# Patient Record
Sex: Male | Born: 1955 | Race: Black or African American | Hispanic: No | Marital: Single | Smoking: Former smoker
Health system: Southern US, Community
[De-identification: ages and names within clinical notes are randomized; demographics above are authoritative.]

## PROBLEM LIST (undated history)

## (undated) DIAGNOSIS — I10 Essential (primary) hypertension: Secondary | ICD-10-CM

## (undated) DIAGNOSIS — M109 Gout, unspecified: Secondary | ICD-10-CM

## (undated) HISTORY — PX: BACK SURGERY: SHX140

---

## 2009-11-25 ENCOUNTER — Emergency Department (HOSPITAL_COMMUNITY): Admission: EM | Admit: 2009-11-25 | Discharge: 2009-11-25 | Payer: Self-pay | Admitting: Emergency Medicine

## 2009-11-26 ENCOUNTER — Emergency Department (HOSPITAL_COMMUNITY): Admission: EM | Admit: 2009-11-26 | Discharge: 2009-11-26 | Payer: Self-pay | Admitting: Family Medicine

## 2009-12-20 ENCOUNTER — Emergency Department (HOSPITAL_COMMUNITY): Admission: EM | Admit: 2009-12-20 | Discharge: 2009-12-20 | Payer: Self-pay | Admitting: Family Medicine

## 2011-05-26 DEATH — deceased

## 2011-09-26 ENCOUNTER — Emergency Department (HOSPITAL_COMMUNITY)
Admission: EM | Admit: 2011-09-26 | Discharge: 2011-09-26 | Disposition: A | Discharge status: Home Health | Payer: Self-pay | Attending: Emergency Medicine | Admitting: Emergency Medicine

## 2011-09-26 ENCOUNTER — Encounter (HOSPITAL_COMMUNITY): Payer: Self-pay | Admitting: *Deleted

## 2011-09-26 DIAGNOSIS — Z7982 Long term (current) use of aspirin: Secondary | ICD-10-CM | POA: Insufficient documentation

## 2011-09-26 DIAGNOSIS — W57XXXA Bitten or stung by nonvenomous insect and other nonvenomous arthropods, initial encounter: Secondary | ICD-10-CM | POA: Insufficient documentation

## 2011-09-26 DIAGNOSIS — T148 Other injury of unspecified body region: Secondary | ICD-10-CM | POA: Insufficient documentation

## 2011-09-26 DIAGNOSIS — I10 Essential (primary) hypertension: Secondary | ICD-10-CM | POA: Insufficient documentation

## 2011-09-26 DIAGNOSIS — F17200 Nicotine dependence, unspecified, uncomplicated: Secondary | ICD-10-CM | POA: Insufficient documentation

## 2011-09-26 HISTORY — DX: Essential (primary) hypertension: I10

## 2011-09-26 MED ORDER — CETIRIZINE-PSEUDOEPHEDRINE ER 5-120 MG PO TB12: 1.0000 | ORAL_TABLET | Freq: Two times a day (BID) | ORAL | Status: DC

## 2011-09-26 MED ORDER — FAMOTIDINE 20 MG PO TABS: 20.0000 mg | ORAL_TABLET | Freq: Two times a day (BID) | ORAL | Status: DC

## 2011-09-26 MED ORDER — LISINOPRIL 10 MG PO TABS: 40.0000 mg | ORAL_TABLET | Freq: Every day | ORAL | Status: DC

## 2011-09-26 MED ORDER — CETIRIZINE HCL 5 MG/5ML PO SYRP
5.0000 mg | ORAL_SOLUTION | Freq: Once | ORAL | Status: AC
  Administered 2011-09-26: 5 mg via ORAL
  Filled 2011-09-26: qty 5

## 2011-09-26 MED ORDER — ACYCLOVIR 400 MG PO TABS: 400.0000 mg | ORAL_TABLET | Freq: Two times a day (BID) | ORAL | Status: AC

## 2011-09-26 MED ORDER — HYDROCHLOROTHIAZIDE 25 MG PO TABS: 25.0000 mg | ORAL_TABLET | Freq: Every day | ORAL | Status: DC

## 2011-09-26 MED ORDER — ASPIRIN 81 MG PO CHEW: 81.0000 mg | CHEWABLE_TABLET | Freq: Every day | ORAL | Status: DC

## 2011-09-26 MED ORDER — FAMOTIDINE 20 MG PO TABS
20.0000 mg | ORAL_TABLET | Freq: Once | ORAL | Status: AC
  Administered 2011-09-26: 20 mg via ORAL
  Filled 2011-09-26: qty 1

## 2011-09-26 NOTE — ED Notes (Signed)
Pt states stayed in hotel last night; woke up with multiple bug bites over body

## 2011-09-26 NOTE — ED Provider Notes (Signed)
History     CSN: [id number]  Arrival date & time 09/26/11  0620   First MD Initiated Contact with Patient 09/26/11 0700      Chief Complaint  Patient presents with  . Insect Bite     HPI  The patient p/w concerns of insect bites.  He was in his [facility] until last night.  He stayed in a new hotel (in town for work) and awoke a few hours ago with innumerable pruritic lesions.  He showered, and changed his clothes, and now presents for evaluation.  He denies any ongoing f/c, n/v.  The lesions remain itchy, but are not painful.  No intra-oral or genital lesions.  Past Medical History  Diagnosis Date  . Hypertension     Past Surgical History  Procedure Date  . Back surgery     History reviewed. No pertinent family history.  History  Substance Use Topics  . Smoking status: Current Everyday Smoker -- 0.5 packs/day  . Smokeless tobacco: Not on file  . Alcohol Use: No      Review of Systems  All other systems reviewed and are negative.    Allergies  Review of patient's allergies indicates no known allergies.  Home Medications   Current Outpatient Rx  Name Route Sig Dispense Refill  . ASPIRIN 325 MG PO TABS Oral Take 650 mg by mouth daily.    Marland Kitchen CETIRIZINE-PSEUDOEPHEDRINE ER 5-120 MG PO TB12 Oral Take 1 tablet by mouth 2 (two) times daily. 4 tablet 0  . FAMOTIDINE 20 MG PO TABS Oral Take 1 tablet (20 mg total) by mouth 2 (two) times daily. 4 tablet 0    BP 157/107  Pulse 73  Temp(Src) 97.9 F (36.6 C) (Oral)  Resp 20  SpO2 97%  Physical Exam  Nursing note and vitals reviewed. Constitutional: He is oriented to person, place, and time. He appears well-developed and well-nourished. No distress.  HENT:  Head: Normocephalic and atraumatic.  Eyes: Conjunctivae are normal. Pupils are equal, round, and reactive to light.  Cardiovascular: Normal rate and regular rhythm.   Pulmonary/Chest: Effort normal. No stridor. No respiratory distress.  Abdominal: He exhibits no  distension.  Musculoskeletal: He exhibits no edema.  Neurological: He is alert and oriented to person, place, and time. No cranial nerve deficit. He exhibits normal muscle tone. Coordination normal.  Skin: He is not diaphoretic.       Innumerable minimally raised, independent erythematous lesions, ~3/4cm in diameter all about the habitus.  No ulcerations, no spreading or confluent erythema.    ED Course  Procedures (including critical care time)  Labs Reviewed - No data to display No results found.   1. Bed bug bite       MDM  This generally well M presents with new lesions c/w bed bug bites.  There are no signs concerning for cellulitis or confluent infections.  Prior to discharge, he requested refills of his anti-htn and anti-virals.  He denies new outbreaks, but these requests were accomodated.     Gerhard Munch, MD 09/26/11 [phone]

## 2011-09-26 NOTE — Discharge Instructions (Signed)
Bedbugs  Bedbugs are tiny bugs that live in and around beds. They come out at night and bite people lying in bed. Bedbug bites rarely cause a medical problem. The bites do cause red, itchy bumps.  HOME CARE   Only take medicine as told by your doctor.    Wear pajamas with long sleeves and pant legs.    Call a pest control expert. You may need to throw away your mattress. Ask the pest control expert what you can do to keep the bedbugs from coming back. You may need to:    Put a plastic cover over your mattress.    Wash your clothes and bedding in hot water. Dry them in a hot dryer. The temperature should be hotter than 120 F (48.9 C).    Vacuum all around your bed often.    Check all used furniture, bedding, or clothes for bedbugs before you bring them into your house.    Remove bird nests and bat perches around your home.    After you travel, check your clothes and luggage for bedbugs before you bring them into your house. If you find any bedbugs, throw those items away.   GET HELP RIGHT AWAY IF:   You have a fever.    You have red bug bites that keep coming back.    You have red bug bites that itch badly.    You have bug bites that cause a skin rash.    You have scratch marks that are red and sore.   MAKE SURE YOU:   Understand these instructions.    Will watch your condition.    Will get help right away if you are not doing well or get worse.   Document Released: 07/26/2010 Document Revised: 03/30/2011 Document Reviewed: 07/26/2010  [employer] Patient Information 2012 [employer].

## 2011-09-26 NOTE — ED Notes (Signed)
MD at bedside. 

## 2011-12-18 ENCOUNTER — Encounter (HOSPITAL_COMMUNITY): Payer: Self-pay | Admitting: *Deleted

## 2011-12-18 ENCOUNTER — Emergency Department (HOSPITAL_COMMUNITY)
Admission: EM | Admit: 2011-12-18 | Discharge: 2011-12-18 | Disposition: A | Discharge status: Home / Self Care | Payer: Self-pay | Attending: Emergency Medicine | Admitting: Emergency Medicine

## 2011-12-18 ENCOUNTER — Emergency Department (HOSPITAL_COMMUNITY): Payer: Self-pay

## 2011-12-18 DIAGNOSIS — Z7982 Long term (current) use of aspirin: Secondary | ICD-10-CM | POA: Insufficient documentation

## 2011-12-18 DIAGNOSIS — R0602 Shortness of breath: Secondary | ICD-10-CM | POA: Insufficient documentation

## 2011-12-18 DIAGNOSIS — R079 Chest pain, unspecified: Secondary | ICD-10-CM | POA: Insufficient documentation

## 2011-12-18 DIAGNOSIS — I10 Essential (primary) hypertension: Secondary | ICD-10-CM | POA: Insufficient documentation

## 2011-12-18 LAB — COMPREHENSIVE METABOLIC PANEL
AST: 41 U/L — ABNORMAL HIGH (ref 0–37)
Albumin: 3.7 g/dL (ref 3.5–5.2)
Chloride: 108 mEq/L (ref 96–112)
Creatinine, Ser: 1.3 mg/dL (ref 0.50–1.35)
GFR calc Af Amer: 70 mL/min — ABNORMAL LOW (ref >90)
GFR calc non Af Amer: 61 mL/min — ABNORMAL LOW (ref >90)
Glucose, Bld: 86 mg/dL (ref 70–99)
Sodium: 142 mEq/L (ref 135–145)
Total Bilirubin: 0.5 mg/dL (ref 0.3–1.2)
Total Protein: 7.4 g/dL (ref 6.0–8.3)

## 2011-12-18 LAB — CBC WITH DIFFERENTIAL/PLATELET
Basophils Absolute: 0 10*3/uL (ref 0.0–0.1)
Eosinophils Absolute: 0.1 10*3/uL (ref 0.0–0.7)
Eosinophils Relative: 2 % (ref 0–5)
HCT: 46.3 % (ref 39.0–52.0)
Hemoglobin: 15.9 g/dL (ref 13.0–17.0)
Lymphs Abs: 1.7 10*3/uL (ref 0.7–4.0)
MCV: 83.7 fL (ref 78.0–100.0)
Monocytes Absolute: 0.5 10*3/uL (ref 0.1–1.0)
Neutro Abs: 1.8 10*3/uL (ref 1.7–7.7)
Neutrophils Relative %: 44 % (ref 43–77)
RDW: 13.7 % (ref 11.5–15.5)
WBC: 4.1 10*3/uL (ref 4.0–10.5)

## 2011-12-18 MED ORDER — HYDROCHLOROTHIAZIDE 25 MG PO TABS: 25.0000 mg | ORAL_TABLET | Freq: Every day | ORAL | Status: DC

## 2011-12-18 NOTE — ED Notes (Signed)
Patient transported to X-ray 

## 2011-12-18 NOTE — ED Provider Notes (Signed)
History     CSN: [id number]  Arrival date & time 12/18/11  1134   First MD Initiated Contact with Patient 12/18/11 1206      Chief Complaint  Patient presents with  . Shortness of Breath  . Chest Pain   HPI 56 yo male with h/o HTN who presents with 2 days of "feeling bad". Reports shortness of breath, associated with anterior chest discomfort, worst after smoking. Reports productive cough x2 days. Describes sharp shooting left sided chest pain, lasting a few seconds, not worst with activity. Overall feeling weak and lightheaded. Light headedness is worst when he stands up quickly.  Had a headache yesterday for which he took excedrin with relief of symptoms.  No fevers or chills. No sick contacts.  Past Medical History  Diagnosis Date  . Hypertension     Past Surgical History  Procedure Date  . Back surgery     No family history on file.  History  Substance Use Topics  . Smoking status: Current Everyday Smoker -- 0.5 packs/day  . Smokeless tobacco: Not on file  . Alcohol Use: No     Review of Systems  Constitutional: Positive for fatigue. Negative for fever and chills.  Respiratory: Positive for cough and shortness of breath. Negative for wheezing.   Cardiovascular: Positive for chest pain. Negative for palpitations and leg swelling.  Gastrointestinal: Negative for nausea, abdominal pain and diarrhea.  Genitourinary: Negative for dysuria and difficulty urinating.  Musculoskeletal: Positive for myalgias. Negative for back pain and arthralgias.  Skin: Negative for rash.  Neurological: Positive for weakness, light-headedness and headaches. Negative for dizziness and numbness.  Psychiatric/Behavioral: Negative for behavioral problems and agitation.    Allergies  Review of patient's allergies indicates no known allergies.  Home Medications   Current Outpatient Rx  Name Route Sig Dispense Refill  . ASPIRIN 81 MG PO CHEW Oral Chew 81 mg by mouth daily.    .  ASPIRIN-ACETAMINOPHEN-CAFFEINE 250-250-65 MG PO TABS Oral Take 1 tablet by mouth every 6 (six) hours as needed. Pain    . HYDROCHLOROTHIAZIDE 25 MG PO TABS Oral Take 25 mg by mouth daily.    Marland Kitchen LISINOPRIL 10 MG PO TABS Oral Take 40 mg by mouth daily.    Marland Kitchen HYDROCHLOROTHIAZIDE 25 MG PO TABS Oral Take 1 tablet (25 mg total) by mouth daily. 30 tablet 0    BP 151/98  Pulse 57  Temp 99 F (37.2 C) (Oral)  Resp 18  SpO2 96%  Physical Exam  Constitutional: He is oriented to person, place, and time. No distress.  HENT:  Head: Normocephalic.  Mouth/Throat: Oropharynx is clear and moist.  Eyes: EOM are normal. Pupils are equal, round, and reactive to light.  Neck: Normal range of motion. Neck supple.  Cardiovascular: Normal rate and regular rhythm.   No murmur heard. Pulmonary/Chest: Effort normal and breath sounds normal. No respiratory distress. He has no wheezes.       Speaking in full sentences on room air  Abdominal: Soft. Bowel sounds are normal. He exhibits no distension. There is no tenderness.  Musculoskeletal: He exhibits no edema.  Neurological: He is alert and oriented to person, place, and time. No cranial nerve deficit.       5/5 strength in upper and lower extremities bilaterally  Skin: Skin is warm and dry. No rash noted.  Psychiatric: He has a normal mood and affect. His behavior is normal.    ED Course  Procedures (including critical care time)  Date: 12/18/2011  Rate: 65  Rhythm: normal sinus rhythm  QRS Axis: normal  Intervals: normal  ST/T Wave abnormalities: no acute ST elevation   Conduction Disutrbances: none  Old EKG Reviewed: none available   CBC    Component Value Date/Time   WBC 4.1 12/18/2011 1247   RBC 5.53 12/18/2011 1247   HGB 15.9 12/18/2011 1247   HCT 46.3 12/18/2011 1247   PLT 198 12/18/2011 1247   MCV 83.7 12/18/2011 1247   MCH 28.8 12/18/2011 1247   MCHC 34.3 12/18/2011 1247   RDW 13.7 12/18/2011 1247   LYMPHSABS 1.7 12/18/2011 1247   MONOABS  0.5 12/18/2011 1247   EOSABS 0.1 12/18/2011 1247   BASOSABS 0.0 12/18/2011 1247    CMP     Component Value Date/Time   NA 142 12/18/2011 1247   K 4.0 12/18/2011 1247   CL 108 12/18/2011 1247   CO2 25 12/18/2011 1247   GLUCOSE 86 12/18/2011 1247   BUN 12 12/18/2011 1247   CREATININE 1.30 12/18/2011 1247   CALCIUM 9.2 12/18/2011 1247   PROT 7.4 12/18/2011 1247   ALBUMIN 3.7 12/18/2011 1247   AST 41* 12/18/2011 1247   ALT 30 12/18/2011 1247   ALKPHOS 58 12/18/2011 1247   BILITOT 0.5 12/18/2011 1247   GFRNONAA 61* 12/18/2011 1247   GFRAA 70* 12/18/2011 1247     Labs Reviewed  COMPREHENSIVE METABOLIC PANEL - Abnormal; Notable for the following:    AST 41 (*)     GFR calc non Af Amer 61 (*)     GFR calc Af Amer 70 (*)     All other components within normal limits  CBC WITH DIFFERENTIAL  POCT I-STAT TROPONIN I   Dg Chest 2 View  12/18/2011  *RADIOLOGY REPORT*  Clinical Data: Chest pain with shortness of breath for 3 days.  CHEST - 2 VIEW  Comparison: None.  Findings: The heart size and mediastinal contours are normal aside from mild aortic tortuosity.  The lungs are clear.  There is no pleural effusion or pneumothorax.  No acute osseous findings are identified.  Telemetry leads overlie the chest.  IMPRESSION: No active cardiopulmonary process.   Original Report Authenticated By: Gerrianne Scale, M.D.     1. Hypertension   2. Shortness of breath       MDM  No evidence of acute cardiopulmonary process on CXR. Negative troponin. Vital signs stable. No evidence of hypoxia or tachypnea. Given risk factors of smoking history, Hypertension and possible family history, recommended outpatient stress test follow up within the next week or so.  Reviewed red flags for return to ED.  BMP within normal limits. Refilled home HCTZ 25mg  daily since patient reported being out. Gave resource sheet for primary care doctor.         Lonia Skinner, MD 12/18/11 1610  Lonia Skinner, MD 12/18/11  [phone]

## 2011-12-18 NOTE — ED Notes (Signed)
MD at bedside. 

## 2011-12-18 NOTE — ED Notes (Signed)
Pt placed on cardiac monitor on arrival to room, 2L [place] placed on pt. Dr at bedside on RN arrival to room.

## 2011-12-20 NOTE — ED Provider Notes (Signed)
I saw and evaluated the patient, reviewed the resident's note and I agree with the findings and plan. Pt states recently when smoking feels funny in chest as if something out of place or wrong. States he is worried he may have lung ca. Pt had a few second episode of sharp cp. No persistent or pleuritic cp. No exertional cp. No sob. No nv or diaphoreisis. Chest cta.   Suzi Roots, MD 12/20/11 [phone]

## 2011-12-29 ENCOUNTER — Encounter (HOSPITAL_COMMUNITY): Payer: Self-pay | Admitting: Emergency Medicine

## 2011-12-29 ENCOUNTER — Emergency Department (INDEPENDENT_AMBULATORY_CARE_PROVIDER_SITE_OTHER)
Admission: EM | Admit: 2011-12-29 | Discharge: 2011-12-29 | Disposition: A | Discharge status: Home / Self Care | Payer: Self-pay | Source: Home / Self Care | Attending: Emergency Medicine | Admitting: Emergency Medicine

## 2011-12-29 DIAGNOSIS — R369 Urethral discharge, unspecified: Secondary | ICD-10-CM

## 2011-12-29 LAB — POCT URINALYSIS DIP (DEVICE)
Hgb urine dipstick: NEGATIVE
Nitrite: NEGATIVE
Protein, ur: NEGATIVE mg/dL
pH: 6.5 (ref 5.0–8.0)

## 2011-12-29 MED ORDER — ACYCLOVIR 400 MG PO TABS: 400.0000 mg | ORAL_TABLET | Freq: Two times a day (BID) | ORAL | Status: DC

## 2011-12-29 NOTE — ED Notes (Signed)
Urine completed and reveiwed by physician, ready for discharge

## 2011-12-29 NOTE — ED Notes (Signed)
Discharge pending second urine specimen for second urine related test

## 2011-12-29 NOTE — ED Notes (Signed)
Reports penile discharge for 7 days .  Herpes breakout is getting ready to occur per patient, out of acyclovir.

## 2011-12-29 NOTE — ED Provider Notes (Signed)
History     CSN: [id number]  Arrival date & time 12/29/11  1122   None     Chief Complaint  Patient presents with  . Exposure to STD    (Consider location/radiation/quality/duration/timing/severity/associated sxs/prior treatment) The history is provided by the patient.  56 y.o. male complains of yellow penis discharge for 7 days.  + pelvic pain, no fever.  No UTI symptoms. Sexually active, sometimes uses condoms, new partner.  Last unprotected intercourse 2 weeks ago.  Denies history of known exposure to STD or symptoms in partner.  + hx of chlamydia, states feels like same symptoms.  Request refill of acyclovir for known history of genital herpes.    Past Medical History  Diagnosis Date  . Hypertension     Past Surgical History  Procedure Date  . Back surgery     No family history on file.  History  Substance Use Topics  . Smoking status: Current Everyday Smoker -- 0.5 packs/day  . Smokeless tobacco: Not on file  . Alcohol Use: Yes      Review of Systems  Constitutional: Negative.   Respiratory: Negative.   Cardiovascular: Negative.   Gastrointestinal: Positive for abdominal pain. Negative for nausea, vomiting and diarrhea.  Genitourinary: Positive for discharge. Negative for dysuria, urgency, flank pain, decreased urine volume, penile swelling, penile pain and testicular pain.    Allergies  Review of patient's allergies indicates no known allergies.  Home Medications   Current Outpatient Rx  Name Route Sig Dispense Refill  . HYDROCHLOROTHIAZIDE 25 MG PO TABS Oral Take 25 mg by mouth daily.    . ACYCLOVIR 400 MG PO TABS Oral Take 1 tablet (400 mg total) by mouth 2 (two) times daily. 60 tablet 5  . ASPIRIN 81 MG PO CHEW Oral Chew 81 mg by mouth daily.    . ASPIRIN-ACETAMINOPHEN-CAFFEINE 250-250-65 MG PO TABS Oral Take 1 tablet by mouth every 6 (six) hours as needed. Pain    . HYDROCHLOROTHIAZIDE 25 MG PO TABS Oral Take 1 tablet (25 mg total) by mouth daily. 30  tablet 0  . LISINOPRIL 10 MG PO TABS Oral Take 40 mg by mouth daily.      BP 158/97  Pulse 51  Temp 98 F (36.7 C) (Oral)  Resp 18  SpO2 99%  Physical Exam  Nursing note and vitals reviewed. Constitutional: He is oriented to person, place, and time. Vital signs are normal. He appears well-developed and well-nourished. He is active and cooperative.  HENT:  Head: Normocephalic.  Mouth/Throat: Oropharynx is clear and moist. No oropharyngeal exudate.  Eyes: Conjunctivae are normal. Pupils are equal, round, and reactive to light. No scleral icterus.  Neck: Trachea normal. Neck supple.  Cardiovascular: Normal rate and regular rhythm.   Pulmonary/Chest: Effort normal and breath sounds normal.  Abdominal: Soft. Bowel sounds are normal. There is no tenderness. There is no rebound.  Genitourinary: Testes normal and penis normal. Cremasteric reflex is present. Circumcised. No penile tenderness. No discharge found.       2 Lesion noted on glans  Lymphadenopathy:    He has no cervical adenopathy.    He has no axillary adenopathy.       Right: No inguinal adenopathy present.       Left: No inguinal adenopathy present.  Neurological: He is alert and oriented to person, place, and time. No cranial nerve deficit or sensory deficit.  Skin: Skin is warm and dry.  Psychiatric: He has a normal mood and affect. His speech  is normal and behavior is normal. Judgment and thought content normal. Cognition and memory are normal.    ED Course  Procedures (including critical care time)   Labs Reviewed  GC/CHLAMYDIA PROBE AMP, URINE  POCT URINALYSIS DIP (DEVICE)   No results found.   1. Penile discharge       MDM  Sent off GC/chlamydia. Will not treat empirically now. Advised patient to refrain from sexual contact until he knows lab results, symptoms resolve, and partner(s) are treated. Pt provided working phone number. Pt agrees.         Johnsie Kindred, NP 01/02/12 1440

## 2011-12-29 NOTE — ED Notes (Signed)
Patient is argumentative.  Repeatedly reviewed discharge instructions and rational.  Carmen, pa stepped to the treatment room with lengthy, understandable explanation.  Community resources will be added to discharge instructions and quantity of acyclovir reordered by carmen, np

## 2012-01-02 NOTE — ED Provider Notes (Signed)
Medical screening examination/treatment/procedure(s) were performed by non-physician practitioner and as supervising physician I was immediately available for consultation/collaboration.  Raynald Blend, MD 01/02/12 [phone]

## 2012-04-08 ENCOUNTER — Encounter (HOSPITAL_COMMUNITY): Payer: Self-pay | Admitting: *Deleted

## 2012-04-08 ENCOUNTER — Emergency Department (HOSPITAL_COMMUNITY)
Admission: EM | Admit: 2012-04-08 | Discharge: 2012-04-08 | Disposition: A | Discharge status: Home / Self Care | Payer: Self-pay | Attending: Emergency Medicine | Admitting: Emergency Medicine

## 2012-04-08 ENCOUNTER — Other Ambulatory Visit: Payer: Self-pay

## 2012-04-08 DIAGNOSIS — F17200 Nicotine dependence, unspecified, uncomplicated: Secondary | ICD-10-CM | POA: Insufficient documentation

## 2012-04-08 DIAGNOSIS — R51 Headache: Secondary | ICD-10-CM | POA: Insufficient documentation

## 2012-04-08 DIAGNOSIS — Z7982 Long term (current) use of aspirin: Secondary | ICD-10-CM | POA: Insufficient documentation

## 2012-04-08 DIAGNOSIS — Z79899 Other long term (current) drug therapy: Secondary | ICD-10-CM | POA: Insufficient documentation

## 2012-04-08 DIAGNOSIS — I10 Essential (primary) hypertension: Secondary | ICD-10-CM | POA: Insufficient documentation

## 2012-04-08 LAB — CBC WITH DIFFERENTIAL/PLATELET
Eosinophils Absolute: 0.1 10*3/uL (ref 0.0–0.7)
HCT: 48 % (ref 39.0–52.0)
Hemoglobin: 16.2 g/dL (ref 13.0–17.0)
Lymphs Abs: 2.2 10*3/uL (ref 0.7–4.0)
MCH: 29.4 pg (ref 26.0–34.0)
Monocytes Absolute: 0.4 10*3/uL (ref 0.1–1.0)
Monocytes Relative: 11 % (ref 3–12)
Neutro Abs: 1.2 10*3/uL — ABNORMAL LOW (ref 1.7–7.7)
Neutrophils Relative %: 31 % — ABNORMAL LOW (ref 43–77)
RBC: 5.51 MIL/uL (ref 4.22–5.81)

## 2012-04-08 LAB — POCT I-STAT TROPONIN I

## 2012-04-08 LAB — BASIC METABOLIC PANEL
CO2: 28 mEq/L (ref 19–32)
Chloride: 102 mEq/L (ref 96–112)
GFR calc non Af Amer: >90 mL/min (ref >90)
Glucose, Bld: 88 mg/dL (ref 70–99)
Potassium: 4.1 mEq/L (ref 3.5–5.1)
Sodium: 140 mEq/L (ref 135–145)

## 2012-04-08 MED ORDER — LISINOPRIL 10 MG PO TABS: 10.0000 mg | ORAL_TABLET | Freq: Every day | ORAL | Status: DC

## 2012-04-08 MED ORDER — HYDROCHLOROTHIAZIDE 25 MG PO TABS: 25.0000 mg | ORAL_TABLET | Freq: Every day | ORAL | Status: DC

## 2012-04-08 NOTE — ED Provider Notes (Signed)
History     CSN: [id number]  Arrival date & time 04/08/12  1314   First MD Initiated Contact with Patient 04/08/12 1958      Chief Complaint  Patient presents with  . Hypertension  . Headache     HPI Elevated HTN Onset - unknown time ago Course - stable Worsened by - nothing Improved by - nothing  Pt presents with HTN - he reports he didn't feel well so he went to [facility] to have his BP checked and it was elevated.  He came to be evaluated He has not taken his meds in "a long time" No cp/sob.  He reports mild headache.  No visual changes.  No focal weakness.   Past Medical History  Diagnosis Date  . Hypertension     Past Surgical History  Procedure Date  . Back surgery     No family history on file.  History  Substance Use Topics  . Smoking status: Current Every Day Smoker -- 0.5 packs/day  . Smokeless tobacco: Not on file  . Alcohol Use: Yes      Review of Systems  Constitutional: Negative for fever.  Respiratory: Negative for shortness of breath.   Cardiovascular: Negative for chest pain.    Allergies  Review of patient's allergies indicates no known allergies.  Home Medications   Current Outpatient Rx  Name  Route  Sig  Dispense  Refill  . ACYCLOVIR 400 MG PO TABS   Oral   Take 1 tablet (400 mg total) by mouth 2 (two) times daily.   60 tablet   5   . ASPIRIN 81 MG PO CHEW   Oral   Chew 81 mg by mouth daily.         Marland Kitchen HYDROCHLOROTHIAZIDE 25 MG PO TABS   Oral   Take 1 tablet (25 mg total) by mouth daily.   30 tablet   0   . LISINOPRIL 10 MG PO TABS   Oral   Take 1 tablet (10 mg total) by mouth daily.   30 tablet   0     BP 164/102  Pulse 63  Temp 98 F (36.7 C) (Oral)  Resp 18  SpO2 96%  Physical Exam CONSTITUTIONAL: Well developed/well nourished HEAD AND FACE: Normocephalic/atraumatic EYES: EOMI ENMT: Mucous membranes moist NECK: supple no meningeal signs CV: S1/S2 noted, no murmurs/rubs/gallops noted LUNGS:  Lungs are clear to auscultation bilaterally, no apparent distress ABDOMEN: soft, nontender, no rebound or guarding NEURO: Pt is awake/alert, moves all extremitiesx4 No ataxia.  No arm/leg drift.  No facial droop EXTREMITIES: pulses normal, full ROM SKIN: warm, color normal PSYCH: no abnormalities of mood noted  ED Course  Procedures  Labs Reviewed  CBC WITH DIFFERENTIAL - Abnormal; Notable for the following:    Neutrophils Relative 31 (*)     Neutro Abs 1.2 (*)     Lymphocytes Relative 55 (*)     All other components within normal limits  BASIC METABOLIC PANEL  POCT I-STAT TROPONIN I     1. HTN (hypertension)     Pt well appearing no distress, watching TV.  I doubt acute HTN emergency He has been on HCTZ/lisinopril before, will restart Advised to f/u as outpatient   MDM  Nursing notes including past medical history and social history reviewed and considered in documentation Labs/vital reviewed and considered        Date: 04/08/2012  Rate: 62  Rhythm: normal sinus rhythm  QRS Axis: normal  Intervals:  normal  ST/T Wave abnormalities: nonspecific ST changes  Conduction Disutrbances:none     Joya Gaskins, MD 04/09/12 [phone]

## 2012-04-08 NOTE — ED Notes (Signed)
Pt states it feels like his heart is struggling right now.  Short of breath only with smoking

## 2012-04-08 NOTE — ED Notes (Signed)
[facility] because his head felt like his blood pressure was up.  Pt reports some shortness of breath and continues to smoke.  No chest pain

## 2012-10-14 ENCOUNTER — Encounter (HOSPITAL_COMMUNITY): Payer: Self-pay | Admitting: Emergency Medicine

## 2012-10-14 ENCOUNTER — Emergency Department (HOSPITAL_COMMUNITY)
Admission: EM | Admit: 2012-10-14 | Discharge: 2012-10-14 | Discharge status: Against Medical Advice | Payer: Self-pay | Attending: Emergency Medicine | Admitting: Emergency Medicine

## 2012-10-14 ENCOUNTER — Emergency Department (HOSPITAL_COMMUNITY)
Admission: EM | Admit: 2012-10-14 | Discharge: 2012-10-14 | Disposition: A | Discharge status: Home / Self Care | Payer: BC Managed Care – PPO | Attending: Emergency Medicine | Admitting: Emergency Medicine

## 2012-10-14 DIAGNOSIS — Z7982 Long term (current) use of aspirin: Secondary | ICD-10-CM | POA: Insufficient documentation

## 2012-10-14 DIAGNOSIS — I10 Essential (primary) hypertension: Secondary | ICD-10-CM | POA: Insufficient documentation

## 2012-10-14 DIAGNOSIS — F17200 Nicotine dependence, unspecified, uncomplicated: Secondary | ICD-10-CM | POA: Insufficient documentation

## 2012-10-14 DIAGNOSIS — R221 Localized swelling, mass and lump, neck: Secondary | ICD-10-CM | POA: Insufficient documentation

## 2012-10-14 DIAGNOSIS — K115 Sialolithiasis: Secondary | ICD-10-CM | POA: Insufficient documentation

## 2012-10-14 DIAGNOSIS — Z79899 Other long term (current) drug therapy: Secondary | ICD-10-CM | POA: Insufficient documentation

## 2012-10-14 DIAGNOSIS — R220 Localized swelling, mass and lump, head: Secondary | ICD-10-CM | POA: Insufficient documentation

## 2012-10-14 NOTE — ED Provider Notes (Signed)
History    CSN: [id number] Arrival date & time 10/14/12  1701  First MD Initiated Contact with Patient 10/14/12 2047     Chief Complaint  Patient presents with  . Allergic Reaction   (Consider location/radiation/quality/duration/timing/severity/associated sxs/prior Treatment) HPI History provided by pt.   Pt reports that he developed acute edema of right side of throat this morning while eating a bacon, egg and cheese biscuit from [employer].  It was tender to touch but otherwise non-painful and no associated dsypnea or dysphagia.  Resolved spontaneously shortly after he finished eating.  Recurred while eating lunch.  Has never had this in the past.  No known allergies or PMH.   Past Medical History  Diagnosis Date  . Hypertension    Past Surgical History  Procedure Laterality Date  . Back surgery     No family history on file. History  Substance Use Topics  . Smoking status: Current Every Day Smoker -- 0.50 packs/day    Types: Cigarettes  . Smokeless tobacco: Not on file  . Alcohol Use: Yes     Comment: rarely    Review of Systems  All other systems reviewed and are negative.    Allergies  Review of patient's allergies indicates no known allergies.  Home Medications   Current Outpatient Rx  Name  Route  Sig  Dispense  Refill  . aspirin EC 81 MG tablet   Oral   Take 81 mg by mouth daily.         Marland Kitchen aspirin-acetaminophen-caffeine (EXCEDRIN MIGRAINE) 250-250-65 MG per tablet   Oral   Take 3 tablets by mouth every 8 (eight) hours as needed for pain.         . hydrochlorothiazide (HYDRODIURIL) 25 MG tablet   Oral   Take 25 mg by mouth daily.         Marland Kitchen lisinopril (PRINIVIL,ZESTRIL) 10 MG tablet   Oral   Take 10 mg by mouth daily.          BP 142/84  Pulse 66  Temp(Src) 99.6 F (37.6 C) (Oral)  Resp 16  SpO2 97% Physical Exam  Nursing note and vitals reviewed. Constitutional: He is oriented to person, place, and time. He appears well-developed  and well-nourished. No distress.  HENT:  Head: Normocephalic and atraumatic.  Poor dentition.  No dental tenderness.  No tenderness or edema of buccal mucosa or parotid gland.   Eyes:  Normal appearance  Neck: Normal range of motion. Neck supple. No tracheal deviation present.  No masses  Cardiovascular: Normal rate and regular rhythm.   Pulmonary/Chest: Effort normal and breath sounds normal. No stridor. No respiratory distress.  Musculoskeletal: Normal range of motion.  Lymphadenopathy:    He has no cervical adenopathy.  Neurological: He is alert and oriented to person, place, and time.  Skin: Skin is warm and dry. No rash noted.  Psychiatric: He has a normal mood and affect. His behavior is normal.    ED Course  Procedures (including critical care time) Labs Reviewed - No data to display No results found. No diagnosis found.  MDM  Healthy 57yo M presents w/ intermittent right throat edema and tenderness that is exacerbated by eating.  No significant exam findings.  Observed patient eating.  He became symptomatic but no objective edema on repeat exam.  Suspect submandibular gland stone.  Recommended warm compresses, massage and sour candy.  Referred to ENT for persistent/worsening sx and Return precautions, including fever, worsening pain and dysphagia/dypnea discussed.  9:40 PM   Otilio Miu, PA-C 10/14/12 2140

## 2012-10-14 NOTE — ED Notes (Signed)
No answer x2 

## 2012-10-14 NOTE — ED Notes (Signed)
Pt c/o swelling to gland under jaw when eating and then returns to normal starting today; no distress noted

## 2012-10-14 NOTE — ED Notes (Signed)
Pt presenting to ed with c/o eating something earlier this morning a bacon and egg biscuit and noticing that he had swelling on the right side of his throat. Pt states he ate a blt for lunch and he developed swelling on the right side of his throat again after eating pt's airway is intact pt is in nad at this time

## 2012-10-17 NOTE — ED Provider Notes (Signed)
Medical screening examination/treatment/procedure(s) were performed by non-physician practitioner and as supervising physician I was immediately available for consultation/collaboration.   Kayliegh Boyers L Nahsir Venezia, MD 10/17/12 1933 

## 2016-06-19 ENCOUNTER — Emergency Department (HOSPITAL_BASED_OUTPATIENT_CLINIC_OR_DEPARTMENT_OTHER)
Admission: EM | Admit: 2016-06-19 | Discharge: 2016-06-19 | Disposition: A | Discharge status: Home / Self Care | Payer: Medicaid Other | Attending: Emergency Medicine | Admitting: Emergency Medicine

## 2016-06-19 ENCOUNTER — Encounter (HOSPITAL_BASED_OUTPATIENT_CLINIC_OR_DEPARTMENT_OTHER): Payer: Self-pay | Admitting: Emergency Medicine

## 2016-06-19 DIAGNOSIS — M79672 Pain in left foot: Secondary | ICD-10-CM | POA: Diagnosis present

## 2016-06-19 DIAGNOSIS — I10 Essential (primary) hypertension: Secondary | ICD-10-CM | POA: Insufficient documentation

## 2016-06-19 DIAGNOSIS — Z79899 Other long term (current) drug therapy: Secondary | ICD-10-CM | POA: Diagnosis not present

## 2016-06-19 DIAGNOSIS — M10072 Idiopathic gout, left ankle and foot: Secondary | ICD-10-CM | POA: Insufficient documentation

## 2016-06-19 DIAGNOSIS — M109 Gout, unspecified: Secondary | ICD-10-CM

## 2016-06-19 DIAGNOSIS — Z7982 Long term (current) use of aspirin: Secondary | ICD-10-CM | POA: Insufficient documentation

## 2016-06-19 DIAGNOSIS — Z87891 Personal history of nicotine dependence: Secondary | ICD-10-CM | POA: Insufficient documentation

## 2016-06-19 HISTORY — DX: Gout, unspecified: M10.9

## 2016-06-19 LAB — BASIC METABOLIC PANEL
Anion gap: 6 (ref 5–15)
BUN: 14 mg/dL (ref 6–20)
CHLORIDE: 109 mmol/L (ref 101–111)
CO2: 25 mmol/L (ref 22–32)
CREATININE: 1.3 mg/dL — AB (ref 0.61–1.24)
Calcium: 9.1 mg/dL (ref 8.9–10.3)
GFR calc non Af Amer: 59 mL/min — ABNORMAL LOW (ref >60)
Glucose, Bld: 116 mg/dL — ABNORMAL HIGH (ref 65–99)
Potassium: 3.4 mmol/L — ABNORMAL LOW (ref 3.5–5.1)
SODIUM: 140 mmol/L (ref 135–145)

## 2016-06-19 LAB — CBC WITH DIFFERENTIAL/PLATELET
BASOS ABS: 0 10*3/uL (ref 0.0–0.1)
BASOS PCT: 0 %
EOS ABS: 0.1 10*3/uL (ref 0.0–0.7)
Eosinophils Relative: 2 %
HCT: 39 % (ref 39.0–52.0)
Hemoglobin: 13.2 g/dL (ref 13.0–17.0)
Lymphocytes Relative: 31 %
Lymphs Abs: 1.6 10*3/uL (ref 0.7–4.0)
MCH: 28.8 pg (ref 26.0–34.0)
MCHC: 33.8 g/dL (ref 30.0–36.0)
MCV: 85.2 fL (ref 78.0–100.0)
MONOS PCT: 12 %
Monocytes Absolute: 0.6 10*3/uL (ref 0.1–1.0)
Neutro Abs: 2.8 10*3/uL (ref 1.7–7.7)
Neutrophils Relative %: 55 %
Platelets: 182 10*3/uL (ref 150–400)
RBC: 4.58 MIL/uL (ref 4.22–5.81)
RDW: 14 % (ref 11.5–15.5)
WBC: 5 10*3/uL (ref 4.0–10.5)

## 2016-06-19 MED ORDER — MORPHINE SULFATE (PF) 4 MG/ML IV SOLN
4.0000 mg | Freq: Once | INTRAVENOUS | Status: DC
Start: 2016-06-19 — End: 2016-06-19
  Filled 2016-06-19: qty 1

## 2016-06-19 MED ORDER — PREDNISONE 20 MG PO TABS
40.0000 mg | ORAL_TABLET | Freq: Once | ORAL | Status: AC
  Administered 2016-06-19: 40 mg via ORAL
  Filled 2016-06-19: qty 2

## 2016-06-19 MED ORDER — COLCHICINE 0.6 MG PO TABS
0.6000 mg | ORAL_TABLET | Freq: Once | ORAL | Status: AC
  Administered 2016-06-19: 0.6 mg via ORAL
  Filled 2016-06-19: qty 1

## 2016-06-19 MED ORDER — PREDNISONE 20 MG PO TABS: ORAL_TABLET | ORAL | 0 refills | Status: DC

## 2016-06-19 MED ORDER — COLCHICINE 0.6 MG PO TABS: 0.6000 mg | ORAL_TABLET | Freq: Every day | ORAL | 0 refills | Status: DC

## 2016-06-19 MED ORDER — KETOROLAC TROMETHAMINE 60 MG/2ML IM SOLN
30.0000 mg | Freq: Once | INTRAMUSCULAR | Status: AC
  Administered 2016-06-19: 30 mg via INTRAMUSCULAR
  Filled 2016-06-19: qty 2

## 2016-06-19 NOTE — ED Provider Notes (Signed)
[facility] Provider Note   CSN: [id number] Arrival date & time: 06/19/16  1558  By signing my name below, I, Linna Darner, attest that this documentation has been prepared under the direction and in the presence of physician practitioner, Marily Memos, MD. Electronically Signed: Linna Darner, Scribe. 06/19/2016. 4:53 PM.  History   Chief Complaint Chief Complaint  Patient presents with  . Foot Pain    The history is provided by the patient. No language interpreter was used.     HPI Comments: Robert Barton is a 61 y.o. male with PMHx including gout and HTN who presents to the Emergency Department complaining of constant left great toe pain for two days. He reports associated joint swelling. Pt states his symptoms are consistent with previous gout flare-ups. He states his PCP is in [place] and he receives two unspecified shots twice a year with good control of his gout. No recent trauma to his left foot. He notes his kidney function was elevated during his last physical exam one year ago. Pt denies fever, nausea, vomiting, numbness/tingling, neuro deficits, or any other associated symptoms.  Past Medical History:  Diagnosis Date  . Gout   . Hypertension     There are no active problems to display for this patient.   Past Surgical History:  Procedure Laterality Date  . BACK SURGERY         Home Medications    Prior to Admission medications   Medication Sig Start Date End Date Taking? Authorizing Provider  aspirin EC 81 MG tablet Take 81 mg by mouth daily.   Yes Historical Provider, MD  aspirin-acetaminophen-caffeine (EXCEDRIN MIGRAINE) [phone] MG per tablet Take 3 tablets by mouth every 8 (eight) hours as needed for pain.   Yes Historical Provider, MD  UNKNOWN TO PATIENT    Yes Historical Provider, MD  colchicine 0.6 MG tablet Take 1 tablet (0.6 mg total) by mouth daily. 06/19/16   Marily Memos, MD  predniSONE (DELTASONE) 20 MG tablet 3 tabs po daily  x 3 days, then 2 tabs x 3 days, then 1.5 tabs x 3 days, then 1 tab x 3 days, then 0.5 tabs x 3 days 06/19/16   Marily Memos, MD    Family History History reviewed. No pertinent family history.  Social History Social History  Substance Use Topics  . Smoking status: Former Smoker    Packs/day: 0.00  . Smokeless tobacco: Never Used  . Alcohol use Yes     Comment: rarely     Allergies   Patient has no known allergies.   Review of Systems Review of Systems  Constitutional: Negative for fever.  Gastrointestinal: Negative for nausea and vomiting.  Musculoskeletal: Positive for arthralgias and joint swelling.  Neurological: Negative for numbness.  All other systems reviewed and are negative.    Physical Exam Updated Vital Signs BP (!) 155/104   Pulse 67   Temp 98.2 F (36.8 C) (Oral)   Resp 18   Ht 5\' 9"  (1.753 m)   Wt 230 lb (104.3 kg)   SpO2 96%   BMI 33.97 kg/m   Physical Exam  Constitutional: He is oriented to person, place, and time. He appears well-developed and well-nourished. No distress.  HENT:  Head: Normocephalic and atraumatic.  Eyes: Conjunctivae and EOM are normal.  Neck: Neck supple. No tracheal deviation present.  Cardiovascular: Normal rate.   Pulmonary/Chest: Effort normal. No respiratory distress.  Musculoskeletal:       Left foot: There is swelling.  Left foot: erythema and swelling around first MTP joint, pain with ROM of first MTP. Erythema is spread over 3 cm diameter of dorsum of foot. Normal DP pulse.  Neurological: He is alert and oriented to person, place, and time.  Skin: Skin is warm and dry.  Psychiatric: He has a normal mood and affect. His behavior is normal.  Nursing note and vitals reviewed.    ED Treatments / Results  Labs (all labs ordered are listed, but only abnormal results are displayed) Labs Reviewed  BASIC METABOLIC PANEL - Abnormal; Notable for the following:       Result Value   Potassium 3.4 (*)    Glucose, Bld  116 (*)    Creatinine, Ser 1.30 (*)    GFR calc non Af Amer 59 (*)    All other components within normal limits  CBC WITH DIFFERENTIAL/PLATELET    EKG  EKG Interpretation None       Radiology No results found.  Procedures Procedures (including critical care time)  DIAGNOSTIC STUDIES: Oxygen Saturation is 97% on RA, normal by my interpretation.    COORDINATION OF CARE: 4:58 PM Discussed treatment plan with pt at bedside and pt agreed to plan.  Medications Ordered in ED Medications  colchicine tablet 0.6 mg (0.6 mg Oral Given 06/19/16 1831)  predniSONE (DELTASONE) tablet 40 mg (40 mg Oral Given 06/19/16 1831)  ketorolac (TORADOL) injection 30 mg (30 mg Intramuscular Given 06/19/16 1914)     Initial Impression / Assessment and Plan / ED Course  I have reviewed the triage vital signs and the nursing notes.  Pertinent labs & imaging results that were available during my care of the patient were reviewed by me and considered in my medical decision making (see chart for details).     Likely gout doubt septic arthritis. Plan for prednisone taper and colchicine at home. PCP is in [place] and will follow-up there when he goes back home.  Final Clinical Impressions(s) / ED Diagnoses   Final diagnoses:  Acute gout involving toe of left foot, unspecified cause    New Prescriptions Discharge Medication List as of 06/19/2016  7:08 PM    START taking these medications   Details  colchicine 0.6 MG tablet Take 1 tablet (0.6 mg total) by mouth daily., Starting Mon 06/19/2016, Print    predniSONE (DELTASONE) 20 MG tablet 3 tabs po daily x 3 days, then 2 tabs x 3 days, then 1.5 tabs x 3 days, then 1 tab x 3 days, then 0.5 tabs x 3 days, Print       I personally performed the services described in this documentation, which was scribed in my presence. The recorded information has been reviewed and is accurate.    Marily MemosJason Cameran Ahmed, MD 06/19/16 [phone]

## 2016-06-19 NOTE — ED Triage Notes (Signed)
Left foot pain x 2 days.  History of gout.  Pt states it feels like his regular flare ups.

## 2016-07-13 ENCOUNTER — Encounter (HOSPITAL_COMMUNITY): Payer: Self-pay

## 2016-07-13 DIAGNOSIS — I10 Essential (primary) hypertension: Secondary | ICD-10-CM | POA: Insufficient documentation

## 2016-07-13 DIAGNOSIS — Z87891 Personal history of nicotine dependence: Secondary | ICD-10-CM | POA: Diagnosis not present

## 2016-07-13 DIAGNOSIS — R935 Abnormal findings on diagnostic imaging of other abdominal regions, including retroperitoneum: Secondary | ICD-10-CM | POA: Diagnosis not present

## 2016-07-13 DIAGNOSIS — R531 Weakness: Secondary | ICD-10-CM | POA: Insufficient documentation

## 2016-07-13 DIAGNOSIS — Z7982 Long term (current) use of aspirin: Secondary | ICD-10-CM | POA: Diagnosis not present

## 2016-07-13 DIAGNOSIS — Z79899 Other long term (current) drug therapy: Secondary | ICD-10-CM | POA: Diagnosis not present

## 2016-07-13 DIAGNOSIS — R931 Abnormal findings on diagnostic imaging of heart and coronary circulation: Secondary | ICD-10-CM | POA: Diagnosis not present

## 2016-07-13 LAB — CBC WITH DIFFERENTIAL/PLATELET
Basophils Absolute: 0 10*3/uL (ref 0.0–0.1)
Basophils Relative: 0 %
EOS ABS: 0.1 10*3/uL (ref 0.0–0.7)
Eosinophils Relative: 2 %
HCT: 42.6 % (ref 39.0–52.0)
HEMOGLOBIN: 14.2 g/dL (ref 13.0–17.0)
LYMPHS ABS: 2.5 10*3/uL (ref 0.7–4.0)
LYMPHS PCT: 47 %
MCH: 28.8 pg (ref 26.0–34.0)
MCHC: 33.3 g/dL (ref 30.0–36.0)
MCV: 86.4 fL (ref 78.0–100.0)
MONOS PCT: 11 %
Monocytes Absolute: 0.6 10*3/uL (ref 0.1–1.0)
Neutro Abs: 2.1 10*3/uL (ref 1.7–7.7)
Neutrophils Relative %: 40 %
Platelets: 179 10*3/uL (ref 150–400)
RBC: 4.93 MIL/uL (ref 4.22–5.81)
RDW: 14.1 % (ref 11.5–15.5)
WBC: 5.2 10*3/uL (ref 4.0–10.5)

## 2016-07-13 LAB — COMPREHENSIVE METABOLIC PANEL
ALK PHOS: 43 U/L (ref 38–126)
ALT: 44 U/L (ref 17–63)
ANION GAP: 11 (ref 5–15)
AST: 45 U/L — ABNORMAL HIGH (ref 15–41)
Albumin: 3.6 g/dL (ref 3.5–5.0)
BILIRUBIN TOTAL: 0.8 mg/dL (ref 0.3–1.2)
BUN: 16 mg/dL (ref 6–20)
CALCIUM: 9.6 mg/dL (ref 8.9–10.3)
CO2: 24 mmol/L (ref 22–32)
Chloride: 103 mmol/L (ref 101–111)
Creatinine, Ser: 1.34 mg/dL — ABNORMAL HIGH (ref 0.61–1.24)
GFR, EST NON AFRICAN AMERICAN: 56 mL/min — AB (ref >60)
Glucose, Bld: 100 mg/dL — ABNORMAL HIGH (ref 65–99)
Potassium: 3.7 mmol/L (ref 3.5–5.1)
SODIUM: 138 mmol/L (ref 135–145)
TOTAL PROTEIN: 7.4 g/dL (ref 6.5–8.1)

## 2016-07-13 NOTE — ED Triage Notes (Signed)
Pt reports generalized weakness, fatigue, headache ongoing for about a week. He also reports nausea after eating.

## 2016-07-14 ENCOUNTER — Encounter (HOSPITAL_COMMUNITY): Payer: Self-pay

## 2016-07-14 ENCOUNTER — Emergency Department (HOSPITAL_COMMUNITY): Payer: Medicaid Other

## 2016-07-14 ENCOUNTER — Emergency Department (HOSPITAL_COMMUNITY)
Admission: EM | Admit: 2016-07-14 | Discharge: 2016-07-14 | Disposition: A | Discharge status: Home / Self Care | Payer: Medicaid Other | Attending: Emergency Medicine | Admitting: Emergency Medicine

## 2016-07-14 DIAGNOSIS — R531 Weakness: Secondary | ICD-10-CM

## 2016-07-14 LAB — URINALYSIS, ROUTINE W REFLEX MICROSCOPIC
Bilirubin Urine: NEGATIVE
Glucose, UA: NEGATIVE mg/dL
Hgb urine dipstick: NEGATIVE
KETONES UR: NEGATIVE mg/dL
LEUKOCYTES UA: NEGATIVE
NITRITE: NEGATIVE
PROTEIN: NEGATIVE mg/dL
Specific Gravity, Urine: 1.017 (ref 1.005–1.030)
pH: 5 (ref 5.0–8.0)

## 2016-07-14 MED ORDER — SODIUM CHLORIDE 0.9 % IV BOLUS (SEPSIS)
1000.0000 mL | Freq: Once | INTRAVENOUS | Status: AC
  Administered 2016-07-14: 1000 mL via INTRAVENOUS

## 2016-07-14 MED ORDER — ONDANSETRON HCL 4 MG/2ML IJ SOLN
4.0000 mg | Freq: Once | INTRAMUSCULAR | Status: AC
  Administered 2016-07-14: 4 mg via INTRAVENOUS
  Filled 2016-07-14: qty 2

## 2016-07-14 MED ORDER — IOPAMIDOL (ISOVUE-300) INJECTION 61%
INTRAVENOUS | Status: AC
  Administered 2016-07-14: 100 mL
  Filled 2016-07-14: qty 100

## 2016-07-14 NOTE — ED Provider Notes (Signed)
[facility] Provider Note   CSN: [id number] Arrival date & time: 07/13/16  2241   By signing my name below, I, Clovis PuAvnee Patel, attest that this documentation has been prepared under the direction and in the presence of Tomasita CrumbleAdeleke Kanaan Kagawa, MD  Electronically Signed: Clovis PuAvnee Patel, ED Scribe. 07/14/16. 2:08 AM.   History   Chief Complaint Chief Complaint  Patient presents with  . Weakness   HPI Comments:  Robert Barton is a 61 y.o. male, with a PMHx of HTN, who presents to the Emergency Department complaining of persistent fatigue x 1 week. He states he has been sleeping all day long after working. He also reports weakness, pressurized headache, night sweats and a throbbing sensation to his lungs. Pt states his PCP recently told him his LFTs have been elevated. Pt states eating makes his discomfort better. Pt denies fevers, rhinorrhea, a cough or any other associated symptoms. PCP is located in [place]. Pt is a former smoker and notes he quit 5 years ago.   The history is provided by the patient. No language interpreter was used.    Past Medical History:  Diagnosis Date  . Gout   . Hypertension     There are no active problems to display for this patient.   Past Surgical History:  Procedure Laterality Date  . BACK SURGERY      Home Medications    Prior to Admission medications   Medication Sig Start Date End Date Taking? Authorizing Provider  amLODipine (NORVASC) 10 MG tablet Take 10 mg by mouth daily.   Yes Historical Provider, MD  aspirin EC 81 MG tablet Take 81 mg by mouth daily.   Yes Historical Provider, MD  valsartan-hydrochlorothiazide (DIOVAN-HCT) 160-25 MG tablet Take 1 tablet by mouth daily.   Yes Historical Provider, MD  colchicine 0.6 MG tablet Take 1 tablet (0.6 mg total) by mouth daily. Patient not taking: Reported on 07/14/2016 06/19/16   Marily MemosJason Mesner, MD  predniSONE (DELTASONE) 20 MG tablet 3 tabs po daily x 3 days, then 2 tabs x 3 days, then 1.5 tabs x 3  days, then 1 tab x 3 days, then 0.5 tabs x 3 days Patient not taking: Reported on 07/14/2016 06/19/16   Marily MemosJason Mesner, MD    Family History No family history on file.  Social History Social History  Substance Use Topics  . Smoking status: Former Smoker    Packs/day: 0.00  . Smokeless tobacco: Never Used  . Alcohol use Yes     Comment: rarely     Allergies   Patient has no known allergies.   Review of Systems Review of Systems 10 Systems reviewed and are negative for acute change except as noted in the HPI.  Physical Exam Updated Vital Signs BP (!) 142/102 (BP Location: Left Arm)   Pulse 74   Temp 98.6 F (37 C) (Oral)   Resp 18   SpO2 97%   Physical Exam  Constitutional: He is oriented to person, place, and time. Vital signs are normal. He appears well-developed and well-nourished.  Non-toxic appearance. He does not appear ill. No distress.  HENT:  Head: Normocephalic and atraumatic.  Nose: Nose normal.  Mouth/Throat: Oropharynx is clear and moist. No oropharyngeal exudate.  Eyes: Conjunctivae and EOM are normal. Pupils are equal, round, and reactive to light. No scleral icterus.  Neck: Normal range of motion. Neck supple. No tracheal deviation, no edema, no erythema and normal range of motion present. No thyroid mass and no thyromegaly present.  Cardiovascular: Normal rate, regular rhythm, S1 normal, S2 normal, normal heart sounds, intact distal pulses and normal pulses.  Exam reveals no gallop and no friction rub.   No murmur heard. Pulmonary/Chest: Effort normal and breath sounds normal. No respiratory distress. He has no wheezes. He has no rhonchi. He has no rales.  Abdominal: Soft. Normal appearance and bowel sounds are normal. He exhibits no distension, no ascites and no mass. There is no hepatosplenomegaly. There is no tenderness. There is no rebound, no guarding and no CVA tenderness.  Musculoskeletal: Normal range of motion. He exhibits no edema or tenderness.    Lymphadenopathy:    He has no cervical adenopathy.  Neurological: He is alert and oriented to person, place, and time. He has normal strength. No cranial nerve deficit or sensory deficit.  Skin: Skin is warm, dry and intact. No petechiae and no rash noted. He is not diaphoretic. No erythema. No pallor.  Nursing note and vitals reviewed.    ED Treatments / Results  DIAGNOSTIC STUDIES:  Oxygen Saturation is 97% on RA, normal by my interpretation.    COORDINATION OF CARE:  2:00 AM Discussed treatment plan with pt at bedside and pt agreed to plan.  Labs (all labs ordered are listed, but only abnormal results are displayed) Labs Reviewed  COMPREHENSIVE METABOLIC PANEL - Abnormal; Notable for the following:       Result Value   Glucose, Bld 100 (*)    Creatinine, Ser 1.34 (*)    AST 45 (*)    GFR calc non Af Amer 56 (*)    All other components within normal limits  CBC WITH DIFFERENTIAL/PLATELET    EKG  EKG Interpretation None       Radiology No results found.  Procedures Procedures (including critical care time)  Medications Ordered in ED Medications - No data to display   Initial Impression / Assessment and Plan / ED Course  I have reviewed the triage vital signs and the nursing notes.  Pertinent labs & imaging results that were available during my care of the patient were reviewed by me and considered in my medical decision making (see chart for details).    Patient presents to the ED for weakness and vague complaints.  He states he is very concerned he may have cancer because he has a history of hep C but did not take the medication advised by his PCP.  He is requesting evaluation for this.  He states his weakness is decreasing his ability to do his job and he does not want to get fired.  Given this, will obtain CT to evaluate for any masses possibly causing his symptoms.  He was given IVF and zofran in the ED.   UA neg. CT reveals lung nodule and patient  educates to fu within 6 monhts. He appears well and in NAD. VS are normal. PAtient safe for DC.     Final Clinical Impressions(s) / ED Diagnoses   Final diagnoses:  None    New Prescriptions New Prescriptions   No medications on file    I personally performed the services described in this documentation, which was scribed in my presence. The recorded information has been reviewed and is accurate.       Tomasita Crumble, MD 07/14/16 [phone]

## 2016-07-14 NOTE — ED Notes (Signed)
Patient transported to CT 

## 2016-07-15 LAB — URINE CULTURE: CULTURE: NO GROWTH

## 2018-03-28 IMAGING — CT CT CHEST W/ CM
2 of 5 series · 15 of 46 positions shown, 17 images · IV contrast (Iodine)
Comparison: Chest radiograph dated 12/18/2011

CLINICAL DATA: 61-year-old male with weakness and fatigue. Right
upper quadrant abdominal pain.

EXAM:
CT CHEST, ABDOMEN, AND PELVIS WITH CONTRAST
TECHNIQUE: Multidetector CT imaging of the chest, abdomen and pelvis was
performed following the standard protocol during bolus
administration of intravenous contrast.
CONTRAST:  100mL 2X71OV-JNN IOPAMIDOL (2X71OV-JNN) INJECTION 61%

[Series 201: cap with, idose (2) · axial · 0.89mm/px · z∈[+514,+1064]mm · 13 of 130 slices shown, 15 images]
[im 10/130  soft-tissue]
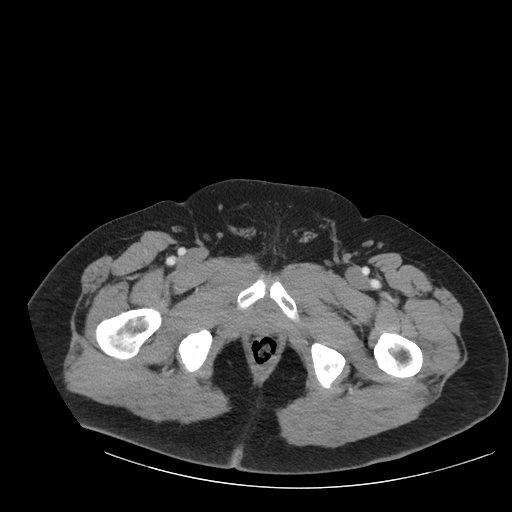
[im 10/130  bone]
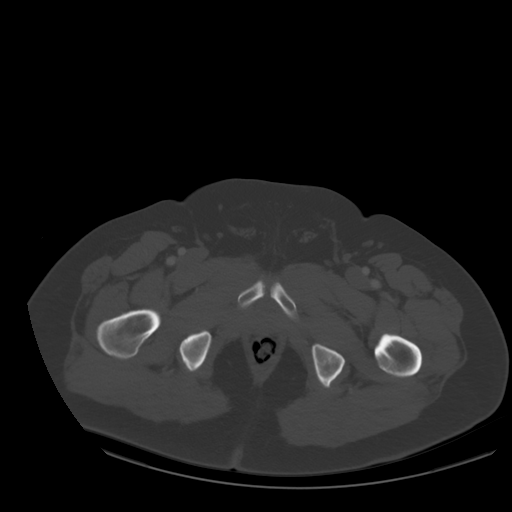
[im 19/130  soft-tissue]
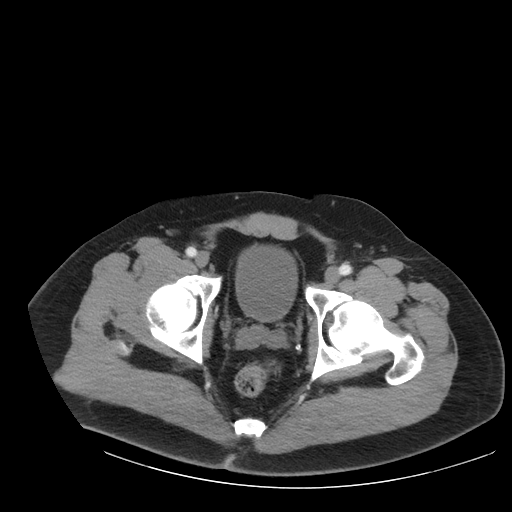
[im 28/130  soft-tissue]
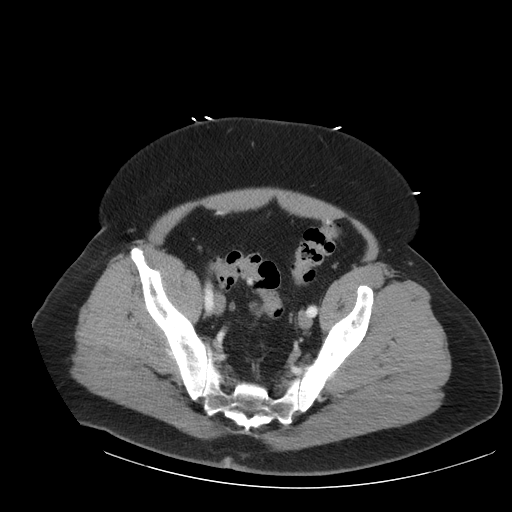
[im 37/130  soft-tissue]
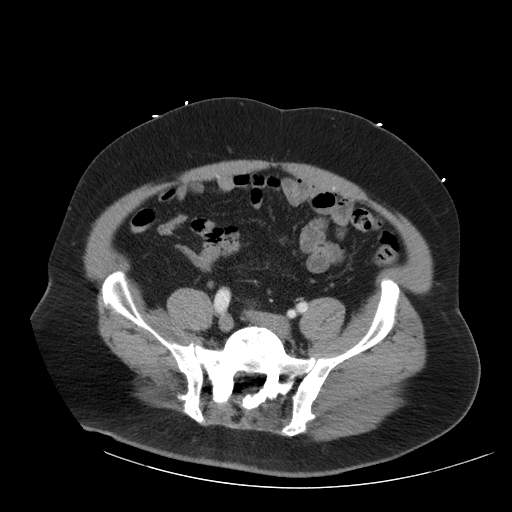
[im 47/130  soft-tissue]
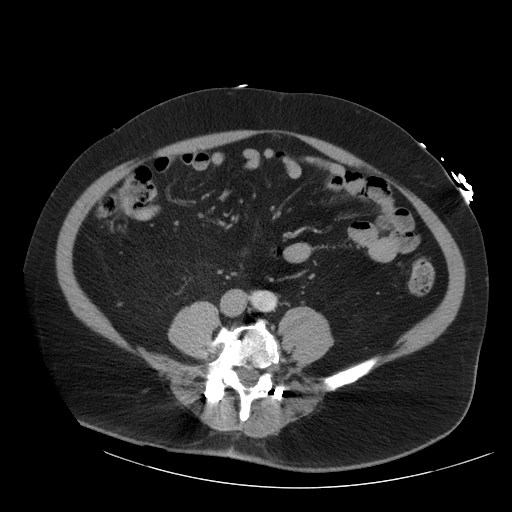
[im 56/130  soft-tissue]
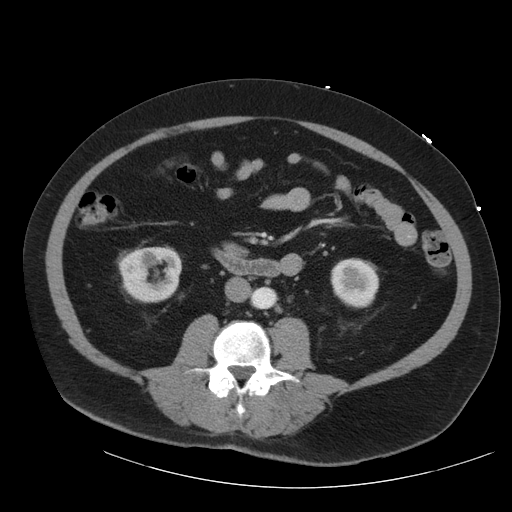
[im 65/130  soft-tissue]
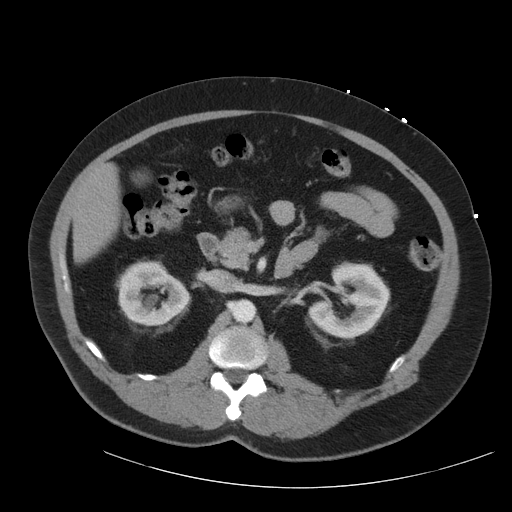
[im 74/130  soft-tissue]
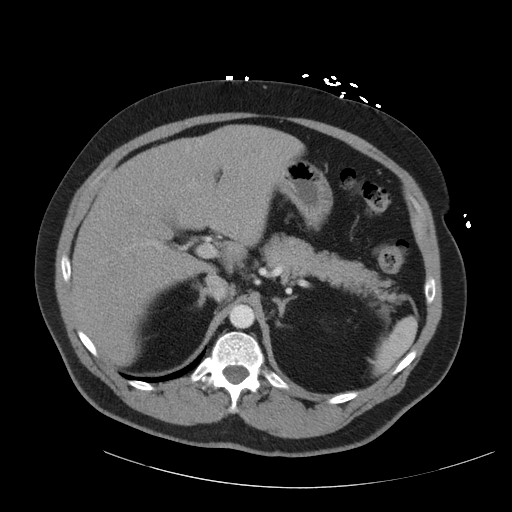
[im 83/130  soft-tissue]
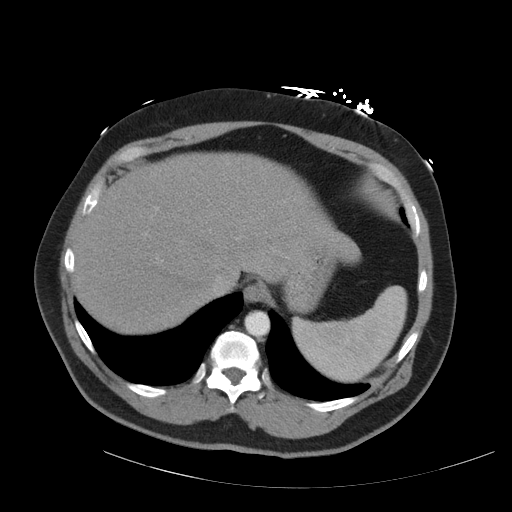
[im 83/130  bone]
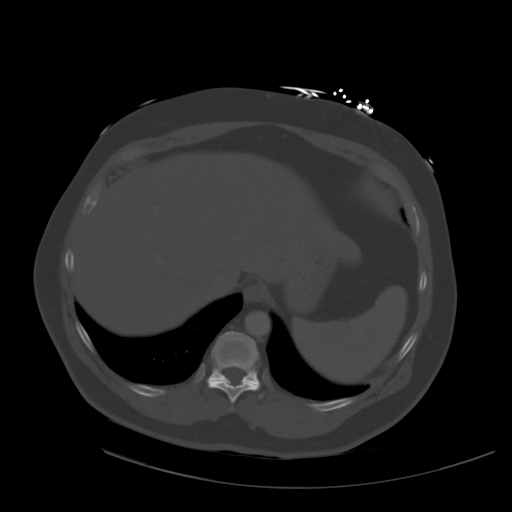
[im 93/130  soft-tissue]
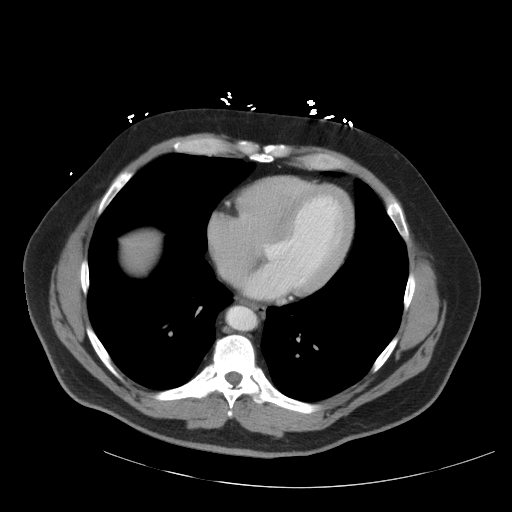
[im 102/130  soft-tissue]
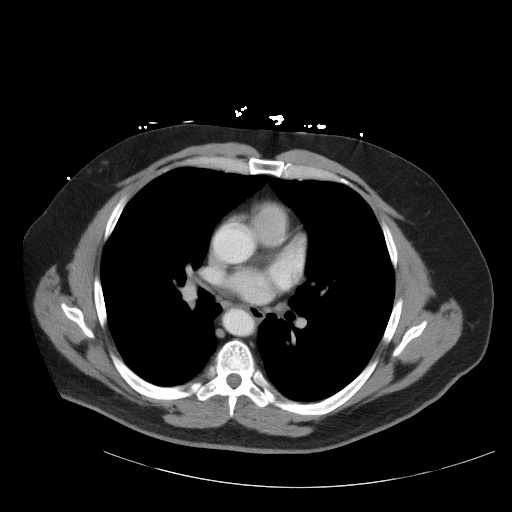
[im 111/130  soft-tissue]
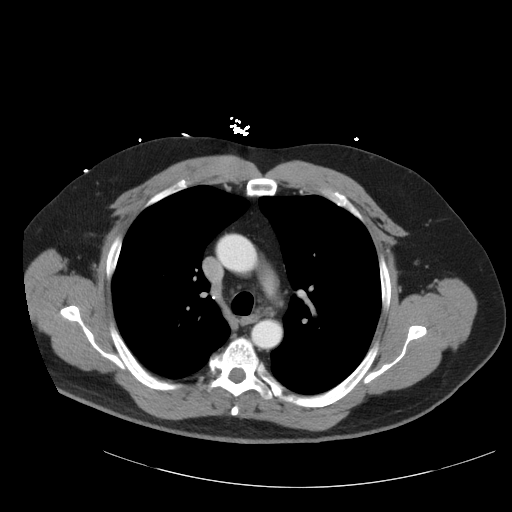
[im 120/130  soft-tissue]
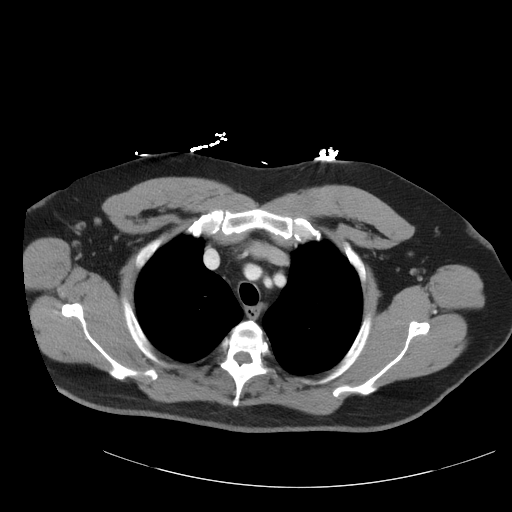

[Series 205: cap with lungs, idose (2) · axial · 0.89mm/px · z∈[+857,+907]mm · 2 of 130 slices shown]
[im 10/130  soft-tissue]
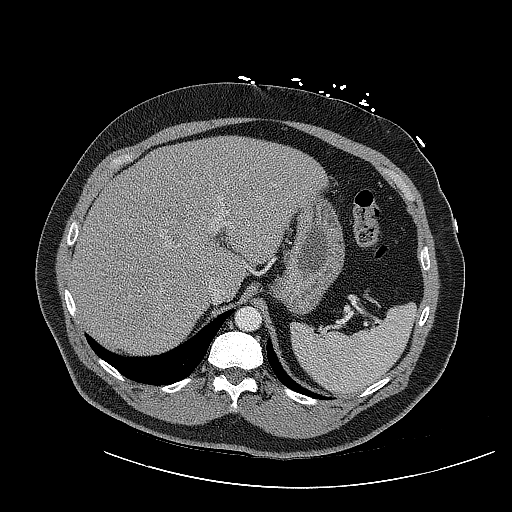
[im 30/130  soft-tissue]
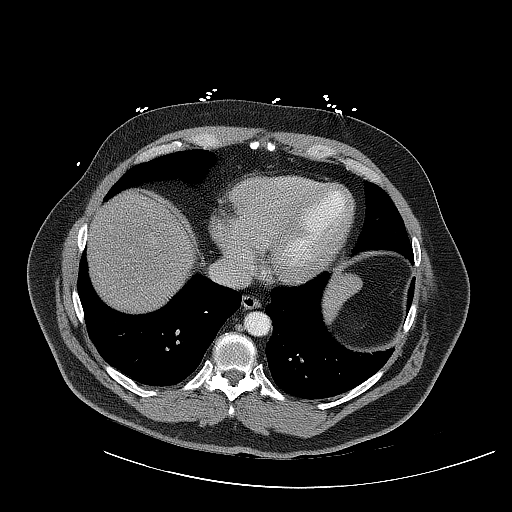

[15 of 46 positions shown; findings below may reference images not displayed]

FINDINGS: CT CHEST FINDINGS

Cardiovascular: There is no cardiomegaly or pericardial effusion.
Minimal coronary vascular calcification involving the LAD. The
thoracic aorta is mildly tortuous otherwise unremarkable. No
aneurysmal dilatation or evidence of dissection. The origins of the
great vessels of the aortic arch appear patent. The central
pulmonary arteries appear patent as visualized and foot degree of
enhancement.

Mediastinum/Nodes: There is no hilar or mediastinal adenopathy. The
esophagus is grossly unremarkable. No thyroid nodules identified.

Lungs/Pleura: Minimal bibasilar linear atelectasis/ scarring. The
lungs are otherwise clear. There is a small right upper lobe
pneumatocele. There is a 5 mm nodule in the superior segment of the
right lower lobe (series 205 image 81) There is no pleural effusion
or pneumothorax. The central airways are patent.

Musculoskeletal: No chest wall mass or suspicious bone lesions
identified.

CT ABDOMEN PELVIS FINDINGS

Hepatobiliary: Diffuse fatty infiltration of the liver. No
intrahepatic biliary ductal dilatation. The gallbladder is
unremarkable.

Pancreas: Unremarkable. No pancreatic ductal dilatation or
surrounding inflammatory changes.

Spleen: Normal in size without focal abnormality.

Adrenals/Urinary Tract: The adrenal glands are unremarkable. There
is a 1 cm right renal inferior pole cyst. Multiple other smaller
hypodense lesions are not well characterized. A subcentimeter left
renal posterior exophytic hypodense lesion is too small to
characterize. Ultrasound may provide better evaluation of the
kidneys lesions. There is mild bilateral perinephric stranding,
nonspecific. Correlation with urinalysis recommended to exclude UTI.
There is homogeneous and symmetric uptake and excretion of contrast
by kidneys bilaterally. The visualized ureters and urinary bladder
appear unremarkable.

Stomach/Bowel: There is moderate stool throughout the colon. There
is sigmoid diverticulosis without active inflammatory changes. There
is no evidence of bowel obstruction or active inflammation. Normal
appendix.

Vascular/Lymphatic: The abdominal aorta and IVC appear unremarkable.
The origins of the celiac axis, SMA, IMA and the renal arteries are
patent. No portal venous gas identified. There is no adenopathy.

Reproductive: The prostate and seminal vesicles are grossly
unremarkable.

Other: Small fat containing umbilical hernia. No intra-abdominal
free air or free fluid noted.

Musculoskeletal: There is degenerative changes of the spine. L4-S1
fusion and posterior fixation screws noted. No acute osseous
pathology.
IMPRESSION: 1. No acute intrathoracic, abdominal, or pelvic pathology.
2. Fatty liver.
3. Sigmoid diverticulosis. No evidence of bowel obstruction or
active inflammation. Normal appendix.
4. Nonspecific perinephric stranding. Correlation with urinalysis
recommended to exclude UTI. Probable bilateral renal cysts.
Ultrasound may provide better characterization of these lesions.
5. **An incidental finding of potential clinical significance has
been found. Right lower lobe 5 mm pulmonary nodule. No follow-up
needed if patient is low-risk. Non-contrast chest CT can be
considered in 12 months if patient is high-risk. This recommendation
follows the consensus statement: Guidelines for Management of
Incidental Pulmonary Nodules Detected on CT Images: From the
